# Patient Record
Sex: Female | Born: 1940 | Race: White | Hispanic: No | Smoking: Never smoker
Health system: Southern US, Community
[De-identification: ages and names within clinical notes are randomized; demographics above are authoritative.]

## PROBLEM LIST (undated history)

## (undated) DIAGNOSIS — I10 Essential (primary) hypertension: Secondary | ICD-10-CM

## (undated) DIAGNOSIS — E7800 Pure hypercholesterolemia, unspecified: Secondary | ICD-10-CM

## (undated) HISTORY — PX: APPENDECTOMY: SHX54

## (undated) HISTORY — DX: Pure hypercholesterolemia, unspecified: E78.00

## (undated) HISTORY — DX: Essential (primary) hypertension: I10

## (undated) HISTORY — PX: HIATAL HERNIA REPAIR: SHX195

---

## 2014-06-23 ENCOUNTER — Encounter (INDEPENDENT_AMBULATORY_CARE_PROVIDER_SITE_OTHER): Payer: Self-pay | Admitting: *Deleted

## 2014-07-19 ENCOUNTER — Ambulatory Visit (INDEPENDENT_AMBULATORY_CARE_PROVIDER_SITE_OTHER): Payer: Self-pay | Admitting: Internal Medicine

## 2014-09-01 ENCOUNTER — Encounter (INDEPENDENT_AMBULATORY_CARE_PROVIDER_SITE_OTHER): Payer: Self-pay | Admitting: *Deleted

## 2014-10-19 ENCOUNTER — Encounter (INDEPENDENT_AMBULATORY_CARE_PROVIDER_SITE_OTHER): Payer: Self-pay | Admitting: *Deleted

## 2014-11-17 ENCOUNTER — Ambulatory Visit (INDEPENDENT_AMBULATORY_CARE_PROVIDER_SITE_OTHER): Payer: Medicare Other | Admitting: Internal Medicine

## 2014-11-17 ENCOUNTER — Encounter (INDEPENDENT_AMBULATORY_CARE_PROVIDER_SITE_OTHER): Payer: Self-pay | Admitting: Internal Medicine

## 2014-11-17 DIAGNOSIS — I10 Essential (primary) hypertension: Secondary | ICD-10-CM | POA: Insufficient documentation

## 2014-11-17 DIAGNOSIS — K921 Melena: Secondary | ICD-10-CM | POA: Diagnosis not present

## 2014-11-17 DIAGNOSIS — E7800 Pure hypercholesterolemia, unspecified: Secondary | ICD-10-CM | POA: Insufficient documentation

## 2014-11-17 DIAGNOSIS — R413 Other amnesia: Secondary | ICD-10-CM | POA: Insufficient documentation

## 2014-11-17 NOTE — Patient Instructions (Signed)
OV in 6 months. CBC was normal. Suspect black stool from the Pepto Bismol.

## 2014-11-17 NOTE — Progress Notes (Addendum)
   Subjective:    Patient ID: Belinda English, female    DOB: 05/25/1940, 74 y.o.   MRN: [id number]  HPI Referred to our office by Dr. Mardene Celeste for dark stools, abdominal discomfort. Per notes on 09/27/2014 she had dark black stools after taking Pepto Bismol. She thinks it was the guacamole she ate.  She says she may have had 2-3 black stools. She has not had any further black stools.  Longstanding hx of abdominal discomfort and constipation . She usually has a BM daily with a Colace. She has not seen any black stools since June. Her stools are brown in color.  Her last colonoscopy was ? 2009 or 2010 by Dr. Aleene Davidson and was normal. She will be due for one in 2020.  Her appetite is good. No weight loss. She does have some acid reflux but does not want to take any    09/28/2014 H and H 13.0 and 39.2. Review of Systems Past Medical History  Diagnosis Date  . Hypertension   . High cholesterol     Past Surgical History  Procedure Laterality Date  . Hiatal hernia repair      2008 Dr. Tamala Bari  . Appendectomy      No Known Allergies  No current outpatient prescriptions on file prior to visit.   No current facility-administered medications on file prior to visit.        Objective:   Physical ExamBlood pressure 102/82, pulse 60, temperature 97.9 F (36.6 C), height  (1.651 m), weight 149 lb 9.6 oz (67.858 kg). Alert and oriented. Skin warm and dry. Oral mucosa is moist.   . Sclera anicteric, conjunctivae is pink. Thyroid not enlarged. No cervical lymphadenopathy. Lungs clear. Heart regular rate and rhythm.  Abdomen is soft. Bowel sounds are positive. No hepatomegaly. No abdominal masses felt. No tenderness.  No edema to lower extremities    Developer: [id number] ( 01/2018) Card: Lot [id number] (12/18)     Assessment & Plan:  Hx of black stools in June after taking Pepto Bismol for an upset stomach. No further problems. Stools was guaiac negative today. CBC was normal.  OV in 6  months. Any further black stools, please call our office. Stool cards x 3.

## 2014-11-22 ENCOUNTER — Encounter (INDEPENDENT_AMBULATORY_CARE_PROVIDER_SITE_OTHER): Payer: Self-pay

## 2014-11-25 ENCOUNTER — Encounter (INDEPENDENT_AMBULATORY_CARE_PROVIDER_SITE_OTHER): Payer: Self-pay | Admitting: *Deleted

## 2015-02-10 ENCOUNTER — Telehealth (INDEPENDENT_AMBULATORY_CARE_PROVIDER_SITE_OTHER): Payer: Self-pay | Admitting: *Deleted

## 2015-02-10 NOTE — Telephone Encounter (Signed)
   Diagnosis:    Result(s)   Card 1: Negative:     Card 2: Negative:   Card 3:Negative:    Completed by: Larsen Zettel LPN   HEMOCCULT SENSA DEVELOPER: LOT#:  [id number] EXPIRATION DATE: 9-17   HEMOCCULT SENSA CARD:  LOT#: [id number]  EXPIRATION DATE: 07/18   CARD CONTROL RESULTS:  POSITIVE: Positive NEGATIVE: Negative    ADDITIONAL COMMENTS: This was forwarded to Delrae Renderri Setzer,NP for review.

## 2015-02-11 NOTE — Telephone Encounter (Signed)
Message left at home 

## 2015-02-16 ENCOUNTER — Encounter (INDEPENDENT_AMBULATORY_CARE_PROVIDER_SITE_OTHER): Payer: Self-pay | Admitting: *Deleted

## 2015-02-21 ENCOUNTER — Encounter (INDEPENDENT_AMBULATORY_CARE_PROVIDER_SITE_OTHER): Payer: Self-pay

## 2015-02-21 ENCOUNTER — Encounter (INDEPENDENT_AMBULATORY_CARE_PROVIDER_SITE_OTHER): Payer: Self-pay | Admitting: Internal Medicine

## 2015-02-21 ENCOUNTER — Ambulatory Visit (INDEPENDENT_AMBULATORY_CARE_PROVIDER_SITE_OTHER): Payer: Medicare Other | Admitting: Internal Medicine

## 2015-02-21 VITALS — BP 112/82 | HR 64 | Temp 97.0°F | Ht 66.0 in | Wt 151.0 lb

## 2015-02-21 DIAGNOSIS — R110 Nausea: Secondary | ICD-10-CM

## 2015-02-21 MED ORDER — ONDANSETRON HCL 4 MG PO TABS: 4.0000 mg | ORAL_TABLET | Freq: Three times a day (TID) | ORAL | Status: DC | PRN

## 2015-02-21 NOTE — Progress Notes (Addendum)
Subjective:    Patient ID: Margareta Laureano, female    DOB: Dec 19, 1940, 74 y.o.   MRN: [id number]  HPI Here today for f/u. He c/o nausea. She tells me she has nausea when she is hungry. She had symptoms for 2-3 weeks. She had an US abdomen one month ago for the nausea and it was normal except for a polyp in her gallbladder. She tells me the nausea resolved after she started taking the  Lexpro. Her appetite has remained good. No weight loss. She has gained 2 pounds since her last visit. She says she had surgery in 2008 for a hiatal hernia at [facility] by Dr. Regino Schultze. Her last colonoscopy was 2009 by Dr. Aleene Davidson which reveal mild internal hemorrhoids, mild diverticulosis.Cecum was visualized. She will be due for one in 2020.  She was last seen in August with c/o black stools after taking Pepto Bismol. Her stool card in office was negative as well as the 3 cards I sent home with her. Her hemoglobin in June was normal.   01/17/2015 US abdomen: nausea:  Simple cysts involving the liver and the right kidney. Single tiny 4 mm gallbladder polyp. Otherwise normal [place]. CBD is 4mm.   09/28/2014 H and H 13.0 AND 39.2, mcv 90.3  Colonoscopy 07/29/2007 Dr. Aleene Davidson: Weight loss, abdominal pain, constipation:  Mild internal hemorrhoids, mild diverticulosis. Cecum was visualized.  09/17/2006 EGD: chest pain regurgitation: Dr. Aleene Davidson: Moderately large hiatus hernia. No erosions, stricture or Barrett's. Biopsies taken mid-esophagus to rule out eosinophilia as cause of chest pain.  Good gastric peristalsis. Biopsy: benign fragments of esophageal mucosa, mid esophagus, endoscopic biopsies.  12/11/2000: colonoscopy with polypectomy, Dr. Aleene Davidson;  Indications: Colon polyp: Mild diverticulosis of the sigmoid colon. Small polyp in the mid transverse colon removed by hot biopsy. Remaining colon to cecum normal. Cecum well visualized.  Biopsy: There is mild increase in the inflammatory component of the lamina  propria.   01/18/2015 Labs:   WBC 5.6, H and H 13.2 AND 38.6, mcv 89.8, pLATELET CT 257,TOTAL BILI 0.5, ALP 65, AST 15, ALT 10, Albumin 4.2, Amylase 61, Lipase 19.    Review of Systems Past Medical History  Diagnosis Date  . Hypertension   . High cholesterol     Past Surgical History  Procedure Laterality Date  . Hiatal hernia repair      2008 Dr. Tamala Bari  . Appendectomy      No Known Allergies  Current Outpatient Prescriptions on File Prior to Visit  Medication Sig Dispense Refill  . Ascorbic Acid (VITAMIN C) 1000 MG tablet Take 1,000 mg by mouth daily.    . cholecalciferol (VITAMIN D) 400 UNITS TABS tablet Take 1,000 Units by mouth.    . co-enzyme Q-10 30 MG capsule Take 200 mg by mouth 3 (three) times daily.    . cyanocobalamin 500 MCG tablet Take 500 mcg by mouth daily.    Marland Kitchen docusate sodium (COLACE) 100 MG capsule Take 100 mg by mouth daily.    . hydrochlorothiazide (HYDRODIURIL) 12.5 MG tablet Take 12.5 mg by mouth daily.    Marland Kitchen lovastatin (MEVACOR) 20 MG tablet Take 20 mg by mouth at bedtime.    . Omega-3 Fatty Acids (FISH OIL) 1000 MG CAPS Take by mouth.    . vitamin A 40981 UNIT capsule Take 8,000 Units by mouth daily.     No current facility-administered medications on file prior to visit.        Objective:   Physical Exam Blood pressure 112/82,  pulse 64, temperature 97 F (36.1 C), height 5\' 6"  (1.676 m), weight 151 lb (68.493 kg).  Alert and oriented. Skin warm and dry. Oral mucosa is moist.   . Sclera anicteric, conjunctivae is pink. Thyroid not enlarged. No cervical lymphadenopathy. Lungs clear. Heart regular rate and rhythm.  Abdomen is soft. Bowel sounds are positive. No hepatomegaly. No abdominal masses felt. No tenderness.  No edema to lower extremities.        Assessment & Plan:  Nausea which has now resolved. US she reports was normal.  Will get recent lab work, US, and last colonoscopy from Dr. Ruby ColaPomposina's office.

## 2015-02-21 NOTE — Patient Instructions (Signed)
Zofran if nausea returns. OV 1 year.

## 2015-04-13 ENCOUNTER — Ambulatory Visit (INDEPENDENT_AMBULATORY_CARE_PROVIDER_SITE_OTHER): Payer: Medicare Other | Admitting: Internal Medicine

## 2015-04-13 ENCOUNTER — Encounter (INDEPENDENT_AMBULATORY_CARE_PROVIDER_SITE_OTHER): Payer: Self-pay | Admitting: Internal Medicine

## 2015-04-13 VITALS — BP 104/66 | HR 72 | Temp 97.9°F | Ht 66.0 in | Wt 147.9 lb

## 2015-04-13 DIAGNOSIS — R110 Nausea: Secondary | ICD-10-CM

## 2015-04-13 LAB — CBC WITH DIFFERENTIAL/PLATELET
BASOS PCT: 1 % (ref 0–1)
Basophils Absolute: 0.1 10*3/uL (ref 0.0–0.1)
EOS ABS: 0.3 10*3/uL (ref 0.0–0.7)
Eosinophils Relative: 4 % (ref 0–5)
HCT: 41.9 % (ref 36.0–46.0)
HEMOGLOBIN: 14 g/dL (ref 12.0–15.0)
Lymphocytes Relative: 29 % (ref 12–46)
Lymphs Abs: 1.9 10*3/uL (ref 0.7–4.0)
MCH: 30.1 pg (ref 26.0–34.0)
MCHC: 33.4 g/dL (ref 30.0–36.0)
MCV: 90.1 fL (ref 78.0–100.0)
MPV: 10.4 fL (ref 8.6–12.4)
Monocytes Absolute: 0.6 10*3/uL (ref 0.1–1.0)
Monocytes Relative: 9 % (ref 3–12)
NEUTROS ABS: 3.7 10*3/uL (ref 1.7–7.7)
NEUTROS PCT: 57 % (ref 43–77)
PLATELETS: 277 10*3/uL (ref 150–400)
RBC: 4.65 MIL/uL (ref 3.87–5.11)
RDW: 13.4 % (ref 11.5–15.5)
WBC: 6.5 10*3/uL (ref 4.0–10.5)

## 2015-04-13 LAB — HEPATIC FUNCTION PANEL
ALBUMIN: 4 g/dL (ref 3.6–5.1)
ALK PHOS: 62 U/L (ref 33–130)
ALT: 10 U/L (ref 6–29)
AST: 16 U/L (ref 10–35)
BILIRUBIN DIRECT: 0.1 mg/dL (ref <=0.2)
BILIRUBIN TOTAL: 0.4 mg/dL (ref 0.2–1.2)
Indirect Bilirubin: 0.3 mg/dL (ref 0.2–1.2)
Total Protein: 6.6 g/dL (ref 6.1–8.1)

## 2015-04-13 NOTE — Patient Instructions (Signed)
CBC,  Hepatic function.  OV in 6 months 

## 2015-04-13 NOTE — Progress Notes (Signed)
Subjective:    Patient ID: Belinda English, female    DOB: 05/20/1940, 75 y.o.   MRN: [id number]  HPI Here today for f/u of her nausea. She tells me she is still having nausea. She has nausea every morning. She tells me the nausea resolves in the afternoon.  She has lost about 3 pounds since her last visit. She really has no further GI symptoms.  She has had headaches for months.  Her appetite is good for the most part. She usually has a BM daily. No melena or BRRB. She has seen Dr. Alita ChylePompisini for her headaches and she has undergone CT headache which was normal she reports.  She is not under a lot of stress.  Hx of inner ear. Her last colonoscopy was 2009 by Dr. Aleene DavidsonSpainhour which reveal mild internal hemorrhoids, mild diverticulosis.Cecum was visualized. She will be due for one in 2020.  She was last seen in August with c/o black stools after taking Pepto Bismol. Her stool card in office was negative as well as the 3 cards I sent home with her. Her hemoglobin in June was normal.   01/17/2015 US abdomen: nausea:   Simple cysts involving the liver and the right kidney. Single tiny 4 mm gallbladder polyp. Otherwise normal US. CBD is 4mm.   09/28/2014 H and H 13.0 AND 39.2, mcv 90.3  Colonoscopy 07/29/2007 Dr. Aleene Davidson[place]hour: Weight loss, abdominal pain, constipation:  Mild internal hemorrhoids, mild diverticulosis. Cecum was visualized.  09/17/2006 EGD: chest pain regurgitation: Dr. Aleene DavidsonSpainhour: Moderately large hiatus hernia. No erosions, stricture or Barrett's. Biopsies taken mid-esophagus to rule out eosinophilia as cause of chest pain. Good gastric peristalsis. Biopsy: benign fragments of esophageal mucosa, mid esophagus, endoscopic biopsies.  12/11/2000: colonoscopy with polypectomy, Dr. Aleene DavidsonSpainhour; Indications: Colon polyp: Mild diverticulosis of the sigmoid colon. Small polyp in the mid transverse colon removed by hot biopsy. Remaining colon to cecum normal. Cecum well visualized.  Biopsy:  There is mild increase in the inflammatory component of the lamina propria.   01/18/2015 Labs:   WBC 5.6, H and H 13.2 AND 38.6, mcv 89.8, pLATELET CT 257,TOTAL BILI 0.5, ALP 65, AST 15, ALT 10, Albumin 4.2, Amylase 61, Lipase 19.    Review of Systems Past Medical History  Diagnosis Date  . Hypertension   . High cholesterol     Past Surgical History  Procedure Laterality Date  . Hiatal hernia repair      2008 Dr. Tamala BariLague  . Appendectomy      No Known Allergies  Current Outpatient Prescriptions on File Prior to Visit  Medication Sig Dispense Refill  . Ascorbic Acid (VITAMIN C) 1000 MG tablet Take 1,000 mg by mouth daily.    . cholecalciferol (VITAMIN D) 400 UNITS TABS tablet Take 1,000 Units by mouth.    . co-enzyme Q-10 30 MG capsule Take 200 mg by mouth 3 (three) times daily.    . cyanocobalamin 500 MCG tablet Take 500 mcg by mouth daily.    Marland Kitchen. docusate sodium (COLACE) 100 MG capsule Take 100 mg by mouth daily.    Marland Kitchen. escitalopram (LEXAPRO) 10 MG tablet Take 10 mg by mouth daily.    . hydrochlorothiazide (HYDRODIURIL) 12.5 MG tablet Take 12.5 mg by mouth daily.    Marland Kitchen. lovastatin (MEVACOR) 20 MG tablet Take 20 mg by mouth at bedtime.    . Omega-3 Fatty Acids (FISH OIL) 1000 MG CAPS Take by mouth.    . ondansetron (ZOFRAN) 4 MG tablet Take 1 tablet (4 mg  total) by mouth every 8 (eight) hours as needed for nausea or vomiting. 30 tablet 1  . vitamin A 91478 UNIT capsule Take 8,000 Units by mouth daily.     No current facility-administered medications on file prior to visit.        Objective:   Physical Exam Blood pressure 104/66, pulse 72, temperature 97.9 F (36.6 C), height 5\' 6"  (1.676 m), weight 147 lb 14.4 oz (67.087 kg). Alert and oriented. Skin warm and dry. Oral mucosa is moist.   . Sclera anicteric, conjunctivae is pink. Thyroid not enlarged. No cervical lymphadenopathy. Lungs clear. Heart regular rate and rhythm.  Abdomen is soft. Bowel sounds are positive. No  hepatomegaly. No abdominal masses felt. No tenderness.  No edema to lower extremities.          Assessment & Plan:  Nausea associated with her Headaches. She may need referral to a neurologist for her headaches. Will get a CBC and Hepatic function today.  Patient advised not to drive and take the Phenergan. She will have OV in 6 months.

## 2015-06-08 ENCOUNTER — Ambulatory Visit (INDEPENDENT_AMBULATORY_CARE_PROVIDER_SITE_OTHER): Payer: Medicare Other | Admitting: Internal Medicine

## 2015-10-12 ENCOUNTER — Ambulatory Visit (INDEPENDENT_AMBULATORY_CARE_PROVIDER_SITE_OTHER): Payer: Medicare Other | Admitting: Internal Medicine

## 2015-12-27 ENCOUNTER — Ambulatory Visit (INDEPENDENT_AMBULATORY_CARE_PROVIDER_SITE_OTHER): Payer: Medicare Other | Admitting: Internal Medicine

## 2016-12-06 ENCOUNTER — Emergency Department (HOSPITAL_COMMUNITY): Payer: Medicare Other

## 2016-12-06 ENCOUNTER — Emergency Department (HOSPITAL_COMMUNITY)
Admission: EM | Admit: 2016-12-06 | Discharge: 2016-12-06 | Disposition: A | Discharge status: Home / Self Care | Payer: Medicare Other | Attending: Emergency Medicine | Admitting: Emergency Medicine

## 2016-12-06 ENCOUNTER — Encounter (HOSPITAL_COMMUNITY): Payer: Self-pay

## 2016-12-06 DIAGNOSIS — Z79899 Other long term (current) drug therapy: Secondary | ICD-10-CM | POA: Insufficient documentation

## 2016-12-06 DIAGNOSIS — M47812 Spondylosis without myelopathy or radiculopathy, cervical region: Secondary | ICD-10-CM

## 2016-12-06 DIAGNOSIS — R51 Headache: Secondary | ICD-10-CM | POA: Insufficient documentation

## 2016-12-06 DIAGNOSIS — R519 Headache, unspecified: Secondary | ICD-10-CM

## 2016-12-06 DIAGNOSIS — K449 Diaphragmatic hernia without obstruction or gangrene: Secondary | ICD-10-CM | POA: Insufficient documentation

## 2016-12-06 DIAGNOSIS — M1990 Unspecified osteoarthritis, unspecified site: Secondary | ICD-10-CM | POA: Diagnosis not present

## 2016-12-06 DIAGNOSIS — M542 Cervicalgia: Secondary | ICD-10-CM

## 2016-12-06 DIAGNOSIS — I10 Essential (primary) hypertension: Secondary | ICD-10-CM | POA: Diagnosis not present

## 2016-12-06 LAB — URINALYSIS, ROUTINE W REFLEX MICROSCOPIC
Bilirubin Urine: NEGATIVE
Glucose, UA: NEGATIVE mg/dL
HGB URINE DIPSTICK: NEGATIVE
Ketones, ur: 5 mg/dL — AB
Leukocytes, UA: NEGATIVE
Nitrite: NEGATIVE
Protein, ur: NEGATIVE mg/dL
SPECIFIC GRAVITY, URINE: 1.016 (ref 1.005–1.030)
pH: 6 (ref 5.0–8.0)

## 2016-12-06 LAB — COMPREHENSIVE METABOLIC PANEL
ALBUMIN: 4.2 g/dL (ref 3.5–5.0)
ALK PHOS: 64 U/L (ref 38–126)
ALT: 14 U/L (ref 14–54)
ANION GAP: 8 (ref 5–15)
AST: 21 U/L (ref 15–41)
BUN: 13 mg/dL (ref 6–20)
CALCIUM: 9.7 mg/dL (ref 8.9–10.3)
CO2: 27 mmol/L (ref 22–32)
Chloride: 100 mmol/L — ABNORMAL LOW (ref 101–111)
Creatinine, Ser: 0.66 mg/dL (ref 0.44–1.00)
GFR calc Af Amer: >60 mL/min (ref >60)
GLUCOSE: 127 mg/dL — AB (ref 65–99)
Potassium: 3.9 mmol/L (ref 3.5–5.1)
Sodium: 135 mmol/L (ref 135–145)
TOTAL PROTEIN: 7.4 g/dL (ref 6.5–8.1)
Total Bilirubin: 0.7 mg/dL (ref 0.3–1.2)

## 2016-12-06 LAB — LIPASE, BLOOD: Lipase: 25 U/L (ref 11–51)

## 2016-12-06 LAB — CBC
HCT: 41.8 % (ref 36.0–46.0)
HEMOGLOBIN: 14.1 g/dL (ref 12.0–15.0)
MCH: 30.7 pg (ref 26.0–34.0)
MCHC: 33.7 g/dL (ref 30.0–36.0)
MCV: 90.9 fL (ref 78.0–100.0)
Platelets: 264 10*3/uL (ref 150–400)
RBC: 4.6 MIL/uL (ref 3.87–5.11)
RDW: 12.8 % (ref 11.5–15.5)
WBC: 5.9 10*3/uL (ref 4.0–10.5)

## 2016-12-06 MED ORDER — ONDANSETRON HCL 8 MG PO TABS: 8.0000 mg | ORAL_TABLET | Freq: Three times a day (TID) | ORAL | 0 refills | Status: AC | PRN

## 2016-12-06 MED ORDER — TRAMADOL HCL 50 MG PO TABS: 50.0000 mg | ORAL_TABLET | Freq: Four times a day (QID) | ORAL | 0 refills | Status: AC | PRN

## 2016-12-06 MED ORDER — IOPAMIDOL (ISOVUE-300) INJECTION 61%
100.0000 mL | Freq: Once | INTRAVENOUS | Status: AC | PRN
  Administered 2016-12-06: 100 mL via INTRAVENOUS

## 2016-12-06 MED ORDER — ONDANSETRON HCL 4 MG/2ML IJ SOLN: 4.0000 mg | Freq: Once | INTRAMUSCULAR | Status: DC

## 2016-12-06 MED ORDER — PREDNISONE 10 MG PO TABS: 20.0000 mg | ORAL_TABLET | Freq: Two times a day (BID) | ORAL | 0 refills | Status: AC

## 2016-12-06 MED ORDER — SODIUM CHLORIDE 0.9 % IV BOLUS (SEPSIS)
500.0000 mL | Freq: Once | INTRAVENOUS | Status: AC
  Administered 2016-12-06: 500 mL via INTRAVENOUS

## 2016-12-06 MED ORDER — ONDANSETRON HCL 4 MG/2ML IJ SOLN
4.0000 mg | Freq: Once | INTRAMUSCULAR | Status: AC
  Administered 2016-12-06: 4 mg via INTRAVENOUS
  Filled 2016-12-06: qty 2

## 2016-12-06 MED ORDER — METHYLPREDNISOLONE SODIUM SUCC 125 MG IJ SOLR
125.0000 mg | Freq: Once | INTRAMUSCULAR | Status: AC
  Administered 2016-12-06: 125 mg via INTRAVENOUS
  Filled 2016-12-06: qty 2

## 2016-12-06 NOTE — Discharge Instructions (Signed)
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  The nausea is nonspecific and may be associated with pain, or your hiatal hernia.  Try using heat on the sore area of your neck 3 or 4 times a day.  See your doctor next week to discuss the results, and see how you are doing.

## 2016-12-06 NOTE — ED Provider Notes (Signed)
[facility]-EMERGENCY DEPT Provider Note   CSN: [id number] Arrival date & time: 12/06/16  1023     History   Chief Complaint Chief Complaint  Patient presents with  . Nausea  . Headache    HPI Belinda English is a 76 y.o. female.  She presents for evaluation of headache neck pain and nausea.  Symptoms present for 1 week.  Tends wax and wane.  They are associated with anorexia.  The headache is moderate, and this morning she felt so bad that she told her friend she thought she was "going to die."  Headache is improved spontaneously.  She denies fever, chills, vomiting, diarrhea, dysuria, urinary frequency, paresthesias, focal weakness.  No prior similar problems.  No known trauma.  She is taking her usual medications.  There are no other known modifying factors.  HPI  Past Medical History:  Diagnosis Date  . High cholesterol   . Hypertension     Patient Active Problem List   Diagnosis Date Noted  . High blood pressure 11/17/2014  . High cholesterol 11/17/2014  . Memory problem 11/17/2014    Past Surgical History:  Procedure Laterality Date  . APPENDECTOMY    . HIATAL HERNIA REPAIR     2008 Dr. Tamala BariLague    OB History    No data available       Home Medications    Prior to Admission medications   Medication Sig Start Date End Date Taking? Authorizing Provider  betamethasone dipropionate (DIPROLENE) 0.05 % ointment Apply 1 application topically daily as needed.   Yes [provider]  BISACODYL EC PO Take 1 tablet by mouth daily as needed.   Yes [provider]  co-enzyme Q-10 30 MG capsule Take 200 mg by mouth daily.    Yes [provider]  cyanocobalamin 500 MCG tablet Take 500 mcg by mouth daily.   Yes [provider]  docusate sodium (COLACE) 100 MG capsule Take 100 mg by mouth daily.   Yes [provider]  escitalopram (LEXAPRO) 10 MG tablet Take 10 mg by mouth daily.   Yes [provider]  hydrochlorothiazide  (HYDRODIURIL) 12.5 MG tablet Take 12.5 mg by mouth daily.   Yes [provider]  ipratropium (ATROVENT) 0.03 % nasal spray Place 1-2 sprays into both nostrils daily as needed. 11/20/16  Yes [provider]  lovastatin (MEVACOR) 20 MG tablet Take 20 mg by mouth at bedtime.   Yes [provider]  Omega-3 Fatty Acids (FISH OIL) 1000 MG CAPS Take by mouth.   Yes [provider]  ondansetron (ZOFRAN) 8 MG tablet Take 1 tablet (8 mg total) by mouth every 8 (eight) hours as needed for nausea or vomiting. 12/06/16   Mancel BaleWentz, Maycol Hoying, MD  predniSONE (DELTASONE) 10 MG tablet Take 2 tablets (20 mg total) by mouth 2 (two) times daily. 12/06/16   Mancel BaleWentz, Louden Houseworth, MD  traMADol (ULTRAM) 50 MG tablet Take 1 tablet (50 mg total) by mouth every 6 (six) hours as needed. 12/06/16   Mancel BaleWentz, Murel Shenberger, MD    Family History Family History  Problem Relation Age of Onset  . Uterine cancer Brother   . Stomach cancer Sister     Social History Social History  Substance Use Topics  . Smoking status: Never Smoker  . Smokeless tobacco: Never Used  . Alcohol use No     Allergies   Patient has no known allergies.   Review of Systems Review of Systems  All other systems reviewed and are  negative.    Physical Exam Updated Vital Signs BP (!) 144/78   Pulse 70   Temp 98.2 F (36.8 C) (Oral)   Resp 16   Ht  (1.676 m)   Wt 68 kg (150 lb)   SpO2 97%   BMI 24.21 kg/m   Physical Exam  Constitutional: She is oriented to person, place, and time. She appears well-developed and well-nourished. No distress.  HENT:  Head: Normocephalic and atraumatic.  Eyes: Pupils are equal, round, and reactive to light. Conjunctivae and EOM are normal.  Neck: Normal range of motion and phonation normal. Neck supple.  Cardiovascular: Normal rate and regular rhythm.   Pulmonary/Chest: Effort normal and breath sounds normal. She exhibits no tenderness.  Abdominal: Soft. She exhibits no distension.  There is no tenderness. There is no guarding.  Musculoskeletal: Normal range of motion.  Neurological: She is alert and oriented to person, place, and time. She exhibits normal muscle tone.  Skin: Skin is warm and dry.  Psychiatric: She has a normal mood and affect. Her behavior is normal. Judgment and thought content normal.  Nursing note and vitals reviewed.    ED Treatments / Results  Labs (all labs ordered are listed, but only abnormal results are displayed) Labs Reviewed  COMPREHENSIVE METABOLIC PANEL - Abnormal; Notable for the following:       Result Value   Chloride 100 (*)    Glucose, Bld 127 (*)    All other components within normal limits  URINALYSIS, ROUTINE W REFLEX MICROSCOPIC - Abnormal; Notable for the following:    Ketones, ur 5 (*)    All other components within normal limits  LIPASE, BLOOD  CBC    EKG  EKG Interpretation None       Radiology Ct Head Wo Contrast  Result Date: 12/06/2016 CLINICAL DATA:  Headache and dizziness with neck pain EXAM: CT HEAD WITHOUT CONTRAST CT CERVICAL SPINE WITHOUT CONTRAST TECHNIQUE: Multidetector CT imaging of the head and cervical spine was performed following the standard protocol without intravenous contrast. Multiplanar CT image reconstructions of the cervical spine were also generated. COMPARISON:  None. FINDINGS: CT HEAD FINDINGS Brain: No evidence of acute infarction, hemorrhage, hydrocephalus, extra-axial collection or mass lesion/mass effect. There is chronic diffuse atrophy. Vascular: No hyperdense vessel or unexpected calcification. Skull: Normal. Negative for fracture or focal lesion. Sinuses/Orbits: No acute finding. Other: None. CT CERVICAL SPINE FINDINGS Alignment: Normal. Skull base and vertebrae: No acute fracture. No primary bone lesion or focal pathologic process. Soft tissues and spinal canal: No prevertebral fluid or swelling. No visible canal hematoma. Disc levels: The intervertebral disc spaces are normal.  There are facet joint sclerosis throughout cervical spine. Upper chest: Biapical scarring are noted. Other: Right parotid gland calcifications are noted. IMPRESSION: No focal acute intracranial abnormality identified. No acute fracture or dislocation of cervical spine. Degenerative joint changes of cervical spine. Electronically Signed   By: Sherian Rein M.D.   On: 12/06/2016 17:20   Ct Cervical Spine Wo Contrast  Result Date: 12/06/2016 CLINICAL DATA:  Headache and dizziness with neck pain EXAM: CT HEAD WITHOUT CONTRAST CT CERVICAL SPINE WITHOUT CONTRAST TECHNIQUE: Multidetector CT imaging of the head and cervical spine was performed following the standard protocol without intravenous contrast. Multiplanar CT image reconstructions of the cervical spine were also generated. COMPARISON:  None. FINDINGS: CT HEAD FINDINGS Brain: No evidence of acute infarction, hemorrhage, hydrocephalus, extra-axial collection or mass lesion/mass effect. There is chronic diffuse atrophy. Vascular: No hyperdense vessel or unexpected  calcification. Skull: Normal. Negative for fracture or focal lesion. Sinuses/Orbits: No acute finding. Other: None. CT CERVICAL SPINE FINDINGS Alignment: Normal. Skull base and vertebrae: No acute fracture. No primary bone lesion or focal pathologic process. Soft tissues and spinal canal: No prevertebral fluid or swelling. No visible canal hematoma. Disc levels: The intervertebral disc spaces are normal. There are facet joint sclerosis throughout cervical spine. Upper chest: Biapical scarring are noted. Other: Right parotid gland calcifications are noted. IMPRESSION: No focal acute intracranial abnormality identified. No acute fracture or dislocation of cervical spine. Degenerative joint changes of cervical spine. Electronically Signed   By: Sherian Rein M.D.   On: 12/06/2016 17:20   Ct Abdomen Pelvis W Contrast  Result Date: 12/06/2016 CLINICAL DATA:  Nausea vomiting and pancreatitis EXAM: CT  ABDOMEN AND PELVIS WITH CONTRAST TECHNIQUE: Multidetector CT imaging of the abdomen and pelvis was performed using the standard protocol following bolus administration of intravenous contrast. CONTRAST:  ISOVUE-300 IOPAMIDOL (ISOVUE-300) INJECTION 61% COMPARISON:  None. FINDINGS: Lower chest: Lung bases demonstrate no consolidation or pleural effusion. Moderate hiatal hernia with mild mucosal enhancement of the herniated stomach. Normal heart size. Hepatobiliary: 2.2 cm cyst in the medial right hepatic lobe. No calcified gallstones or biliary dilatation Pancreas: Unremarkable. No pancreatic ductal dilatation or surrounding inflammatory changes. Spleen: Normal in size without focal abnormality. Adrenals/Urinary Tract: Adrenal glands are within normal limits. No hydronephrosis. 3.1 cm cyst upper pole right kidney. Bladder within normal limits. Stomach/Bowel: Stomach nonenlarged. No dilated small bowel. No colon wall thickening. Appendix not clearly identified, surgical changes in the right lower quadrant suggesting prior appendectomy. Vascular/Lymphatic: Aortic atherosclerosis. No enlarged abdominal or pelvic lymph nodes. Reproductive: Uterus and bilateral adnexa are unremarkable. Other: Fat in the left inguinal canal. Fat and bowel containing right inguinal hernia. Musculoskeletal: Prominent infolding of the inner abdominal wall musculature causing appearance of bandlike densities along the lateral aspects of the abdomen. This is of uncertain significance and etiology. No acute or suspicious bone lesion. IMPRESSION: 1. No CT evidence for acute intra-abdominal or pelvic pathology. 2. Moderate hiatal hernia. Prominent mucosal enhancement of the proximal herniated stomach, query a gastritis. 3. Negative for bowel obstruction. 4. Hepatic and renal cysts 5. Fat and bowel containing hernia in the right groin. No evidence for an obstruction Electronically Signed   By: Jasmine Pang M.D.   On: 12/06/2016 20:07     Procedures Procedures (including critical care time)  Medications Ordered in ED Medications  sodium chloride 0.9 % bolus 500 mL (0 mLs Intravenous Stopped 12/06/16 1841)  ondansetron (ZOFRAN) injection 4 mg (4 mg Intravenous Given 12/06/16 1745)  methylPREDNISolone sodium succinate (SOLU-MEDROL) 125 mg/2 mL injection 125 mg (125 mg Intravenous Given 12/06/16 1822)  iopamidol (ISOVUE-300) 61 % injection 100 mL (100 mLs Intravenous Contrast Given 12/06/16 1929)     Initial Impression / Assessment and Plan / ED Course  I have reviewed the triage vital signs and the nursing notes.  Pertinent labs & imaging results that were available during my care of the patient were reviewed by me and considered in my medical decision making (see chart for details).      Patient Vitals for the past 24 hrs:  BP Temp Temp src Pulse Resp SpO2 Height Weight  12/06/16 2111 - 98.2 F (36.8 C) Oral - - - - -  12/06/16 2100 (!) 144/78 - - 70 - 97 % - -  12/06/16 2030 (!) 149/74 - - 68 - 98 % - -  12/06/16 2012 Marland Kitchen)  149/88 - - 66 16 99 % - -  12/06/16 1900 (!) 141/74 - - 63 - 100 % - -  12/06/16 1830 (!) 146/75 - - 63 - 98 % - -  12/06/16 1745 - - - 67 - 98 % - -  12/06/16 1730 (!) 145/84 - - 64 - 97 % - -  12/06/16 1715 - - - 60 - 99 % - -  12/06/16 1645 - - - 64 - 100 % - -  12/06/16 1630 (!) 158/86 - - - - - - -  12/06/16 1610 (!) 153/87 - - 65 18 100 % - -  12/06/16 1352 (!) 155/85 98.3 F (36.8 C) Oral 64 15 99 % - -  12/06/16 1122 (!) 147/98 (!) 97.3 F (36.3 C) Temporal 76 18 98 % - -  12/06/16 1121 - - - - - -  (1.676 m) 68 kg (150 lb)    At discharge- reevaluation with update and discussion. After initial assessment and treatment, an updated evaluation reveals the patient is comfortable and has no further complaints.  Findings discussed with the patient and her daughter, all questions were answered. Helios Kohlmann L      Final Clinical Impressions(s) / ED Diagnoses   Final diagnoses:   Nonintractable headache, unspecified chronicity pattern, unspecified headache type  Neck pain  DJD (degenerative joint disease), cervical  Hiatal hernia    Nonspecific symptoms headache, with nausea, and ongoing anorexia.  Evaluation indicates cervical spondylosis likely contributing to headache and neck pain.  Anorexia and nausea could possibly be related to persistent hiatal hernia, with nonspecific mucosal changes in the gastric antrum, portion which is herniated into the chest.  It is unclear about the duration of the hernia, since the patient has previously had hiatal hernia repair, remotely.  No evidence for other intra-abdominal pathology.  Plan is to treat cervical spondylosis with a very short course of prednisone, to limit the gastric irritation.  For symptom control she will be given narcotic pain medication and and antiemetic medication, for symptom relief.  Nursing Notes Reviewed/ Care Coordinated Applicable Imaging Reviewed Interpretation of Laboratory Data incorporated into ED treatment  The patient appears reasonably screened and/or stabilized for discharge and I doubt any other medical condition or other [facility] requiring further screening, evaluation, or treatment in the ED at this time prior to discharge.  Plan: Home Medications-continue usual medications; Home Treatments-rest, gradually advance diet, heat to affected area on neck.; return here if the recommended treatment, does not improve the symptoms; Recommended follow up-PCP checkup 1 week.  Discuss with them, further possible evaluation and treatment for recurrent hiatal hernia.     New Prescriptions Discharge Medication List as of 12/06/2016  8:43 PM    START taking these medications   Details  ondansetron (ZOFRAN) 8 MG tablet Take 1 tablet (8 mg total) by mouth every 8 (eight) hours as needed for nausea or vomiting., Starting Thu 12/06/2016, Print    predniSONE (DELTASONE) 10 MG tablet Take 2 tablets (20 mg total) by  mouth 2 (two) times daily., Starting Thu 12/06/2016, Print    traMADol (ULTRAM) 50 MG tablet Take 1 tablet (50 mg total) by mouth every 6 (six) hours as needed., Starting Thu 12/06/2016, Print         Mancel Bale, MD 12/07/16 1110

## 2016-12-06 NOTE — ED Triage Notes (Signed)
Reports of mid abdominal pain, nausea and headache x2 days. Patient reports of every time she drinks the drink comes back up. Patient denies any food stuck in throat.  Denies any confusion, vision issues, numbness of weakness. States she has hital hernia surergy and often has issues with abdominal pain.

## 2016-12-06 NOTE — ED Notes (Signed)
ED Provider at bedside. 

## 2016-12-06 NOTE — ED Notes (Signed)
Patient transported to CT 

## 2017-06-13 ENCOUNTER — Encounter (INDEPENDENT_AMBULATORY_CARE_PROVIDER_SITE_OTHER): Payer: Self-pay | Admitting: *Deleted

## 2017-08-19 ENCOUNTER — Emergency Department (HOSPITAL_COMMUNITY): Payer: Medicare Other

## 2017-08-19 ENCOUNTER — Other Ambulatory Visit: Payer: Self-pay

## 2017-08-19 ENCOUNTER — Encounter (HOSPITAL_COMMUNITY): Payer: Self-pay

## 2017-08-19 ENCOUNTER — Emergency Department (HOSPITAL_COMMUNITY)
Admission: EM | Admit: 2017-08-19 | Discharge: 2017-08-19 | Disposition: A | Discharge status: Home / Self Care | Payer: Medicare Other | Attending: Emergency Medicine | Admitting: Emergency Medicine

## 2017-08-19 DIAGNOSIS — Z79899 Other long term (current) drug therapy: Secondary | ICD-10-CM | POA: Insufficient documentation

## 2017-08-19 DIAGNOSIS — I10 Essential (primary) hypertension: Secondary | ICD-10-CM | POA: Diagnosis not present

## 2017-08-19 DIAGNOSIS — R1110 Vomiting, unspecified: Secondary | ICD-10-CM | POA: Insufficient documentation

## 2017-08-19 DIAGNOSIS — R42 Dizziness and giddiness: Secondary | ICD-10-CM | POA: Insufficient documentation

## 2017-08-19 LAB — COMPREHENSIVE METABOLIC PANEL
ALT: 12 U/L — ABNORMAL LOW (ref 14–54)
AST: 19 U/L (ref 15–41)
Albumin: 4 g/dL (ref 3.5–5.0)
Alkaline Phosphatase: 57 U/L (ref 38–126)
Anion gap: 7 (ref 5–15)
BILIRUBIN TOTAL: 0.7 mg/dL (ref 0.3–1.2)
BUN: 9 mg/dL (ref 6–20)
CHLORIDE: 99 mmol/L — AB (ref 101–111)
CO2: 29 mmol/L (ref 22–32)
Calcium: 10 mg/dL (ref 8.9–10.3)
Creatinine, Ser: 0.57 mg/dL (ref 0.44–1.00)
GFR calc Af Amer: >60 mL/min (ref >60)
GFR calc non Af Amer: >60 mL/min (ref >60)
GLUCOSE: 105 mg/dL — AB (ref 65–99)
POTASSIUM: 3.2 mmol/L — AB (ref 3.5–5.1)
Sodium: 135 mmol/L (ref 135–145)
Total Protein: 6.9 g/dL (ref 6.5–8.1)

## 2017-08-19 LAB — CBC
HEMATOCRIT: 39.4 % (ref 36.0–46.0)
Hemoglobin: 12.9 g/dL (ref 12.0–15.0)
MCH: 30.1 pg (ref 26.0–34.0)
MCHC: 32.7 g/dL (ref 30.0–36.0)
MCV: 91.8 fL (ref 78.0–100.0)
Platelets: 218 10*3/uL (ref 150–400)
RBC: 4.29 MIL/uL (ref 3.87–5.11)
RDW: 12.9 % (ref 11.5–15.5)
WBC: 6.5 10*3/uL (ref 4.0–10.5)

## 2017-08-19 LAB — LIPASE, BLOOD: LIPASE: 30 U/L (ref 11–51)

## 2017-08-19 MED ORDER — ONDANSETRON HCL 4 MG/2ML IJ SOLN
4.0000 mg | Freq: Once | INTRAMUSCULAR | Status: AC | PRN
  Administered 2017-08-19: 4 mg via INTRAVENOUS

## 2017-08-19 MED ORDER — ONDANSETRON HCL 4 MG/2ML IJ SOLN
INTRAMUSCULAR | Status: AC
  Filled 2017-08-19: qty 2

## 2017-08-19 MED ORDER — PROMETHAZINE HCL 25 MG/ML IJ SOLN
12.5000 mg | Freq: Once | INTRAMUSCULAR | Status: AC
  Administered 2017-08-19: 12.5 mg via INTRAVENOUS
  Filled 2017-08-19: qty 1

## 2017-08-19 MED ORDER — SODIUM CHLORIDE 0.9 % IV BOLUS
1000.0000 mL | Freq: Once | INTRAVENOUS | Status: AC
  Administered 2017-08-19: 1000 mL via INTRAVENOUS

## 2017-08-19 MED ORDER — MECLIZINE HCL 25 MG PO TABS: 25.0000 mg | ORAL_TABLET | Freq: Three times a day (TID) | ORAL | 0 refills | Status: AC | PRN

## 2017-08-19 MED ORDER — PROMETHAZINE HCL 25 MG PO TABS: 25.0000 mg | ORAL_TABLET | Freq: Four times a day (QID) | ORAL | 0 refills | Status: AC | PRN

## 2017-08-19 MED ORDER — ONDANSETRON HCL 4 MG/2ML IJ SOLN
4.0000 mg | Freq: Once | INTRAMUSCULAR | Status: AC
  Administered 2017-08-19: 4 mg via INTRAVENOUS
  Filled 2017-08-19: qty 2

## 2017-08-19 MED ORDER — SODIUM CHLORIDE 0.9 % IV BOLUS
500.0000 mL | Freq: Once | INTRAVENOUS | Status: AC
  Administered 2017-08-19: 500 mL via INTRAVENOUS

## 2017-08-19 MED ORDER — METOCLOPRAMIDE HCL 5 MG/ML IJ SOLN
10.0000 mg | Freq: Once | INTRAMUSCULAR | Status: AC
  Administered 2017-08-19: 10 mg via INTRAVENOUS
  Filled 2017-08-19: qty 2

## 2017-08-19 NOTE — ED Provider Notes (Signed)
[facility] EMERGENCY DEPARTMENT Provider Note   CSN: [id number] Arrival date & time: 08/19/17  1236     History   Chief Complaint Chief Complaint  Patient presents with  . Emesis  . Dizziness    HPI Belinda English is a 77 y.o. female.  Patient complains of some dizziness and vomiting.  The history is provided by the patient. No language interpreter was used.  Emesis   This is a new problem. The current episode started 12 to 24 hours ago. The problem occurs 5 to 10 times per day. The problem has not changed since onset.The emesis has an appearance of stomach contents. There has been no fever. Pertinent negatives include no abdominal pain, no chills, no cough, no diarrhea and no headaches.    Past Medical History:  Diagnosis Date  . High cholesterol   . Hypertension     Patient Active Problem List   Diagnosis Date Noted  . High blood pressure 11/17/2014  . High cholesterol 11/17/2014  . Memory problem 11/17/2014    Past Surgical History:  Procedure Laterality Date  . APPENDECTOMY    . HIATAL HERNIA REPAIR     2008 Dr. Tamala Bari     OB History   None      Home Medications    Prior to Admission medications   Medication Sig Start Date End Date Taking? Authorizing Provider  betamethasone dipropionate (DIPROLENE) 0.05 % ointment Apply 1 application topically daily as needed.    [provider]  BISACODYL EC PO Take 1 tablet by mouth daily as needed.    [provider]  co-enzyme Q-10 30 MG capsule Take 200 mg by mouth daily.     [provider]  cyanocobalamin 500 MCG tablet Take 500 mcg by mouth daily.    [provider]  docusate sodium (COLACE) 100 MG capsule Take 100 mg by mouth daily.    [provider]  escitalopram (LEXAPRO) 10 MG tablet Take 10 mg by mouth daily.    [provider]  hydrochlorothiazide (HYDRODIURIL) 12.5 MG tablet Take 12.5 mg by mouth daily.    [provider]  ipratropium  (ATROVENT) 0.03 % nasal spray Place 1-2 sprays into both nostrils daily as needed. 11/20/16   [provider]  lovastatin (MEVACOR) 20 MG tablet Take 20 mg by mouth at bedtime.    [provider]  meclizine (ANTIVERT) 25 MG tablet Take 1 tablet (25 mg total) by mouth 3 (three) times daily as needed for dizziness. 08/19/17   Bethann Berkshire, MD  Omega-3 Fatty Acids (FISH OIL) 1000 MG CAPS Take by mouth.    [provider]  ondansetron (ZOFRAN) 8 MG tablet Take 1 tablet (8 mg total) by mouth every 8 (eight) hours as needed for nausea or vomiting. 12/06/16   Mancel Bale, MD  predniSONE (DELTASONE) 10 MG tablet Take 2 tablets (20 mg total) by mouth 2 (two) times daily. 12/06/16   Mancel Bale, MD  promethazine (PHENERGAN) 25 MG tablet Take 1 tablet (25 mg total) by mouth every 6 (six) hours as needed for nausea or vomiting. 08/19/17   Bethann Berkshire, MD  traMADol (ULTRAM) 50 MG tablet Take 1 tablet (50 mg total) by mouth every 6 (six) hours as needed. 12/06/16   Mancel Bale, MD    Family History Family History  Problem Relation Age of Onset  . Uterine cancer Brother   . Stomach cancer Sister     Social History Social History   Tobacco  Use  . Smoking status: Never Smoker  . Smokeless tobacco: Never Used  Substance Use Topics  . Alcohol use: No    Alcohol/week: 0.0 oz  . Drug use: No     Allergies   Patient has no known allergies.   Review of Systems Review of Systems  Constitutional: Negative for appetite change, chills and fatigue.  HENT: Negative for congestion, ear discharge and sinus pressure.   Eyes: Negative for discharge.  Respiratory: Negative for cough.   Cardiovascular: Negative for chest pain.  Gastrointestinal: Positive for vomiting. Negative for abdominal pain and diarrhea.  Genitourinary: Negative for frequency and hematuria.  Musculoskeletal: Negative for back pain.  Skin: Negative for rash.  Neurological: Positive for dizziness.  Negative for seizures and headaches.  Psychiatric/Behavioral: Negative for hallucinations.     Physical Exam Updated Vital Signs BP 108/69   Pulse 64   Temp 98 F (36.7 C) (Oral)   Resp 16   Ht  (1.676 m)   Wt 61.2 kg (135 lb)   SpO2 94%   BMI 21.79 kg/m   Physical Exam  Constitutional: She is oriented to person, place, and time. She appears well-developed.  HENT:  Head: Normocephalic.  Eyes: Conjunctivae and EOM are normal. No scleral icterus.  Neck: Neck supple. No thyromegaly present.  Cardiovascular: Normal rate and regular rhythm. Exam reveals no gallop and no friction rub.  No murmur heard. Pulmonary/Chest: No stridor. She has no wheezes. She has no rales. She exhibits no tenderness.  Abdominal: She exhibits no distension. There is no tenderness. There is no rebound.  Musculoskeletal: Normal range of motion. She exhibits no edema.  Lymphadenopathy:    She has no cervical adenopathy.  Neurological: She is oriented to person, place, and time. She exhibits normal muscle tone. Coordination normal.  Skin: No rash noted. No erythema.  Psychiatric: She has a normal mood and affect. Her behavior is normal.     ED Treatments / Results  Labs (all labs ordered are listed, but only abnormal results are displayed) Labs Reviewed  COMPREHENSIVE METABOLIC PANEL - Abnormal; Notable for the following components:      Result Value   Potassium 3.2 (*)    Chloride 99 (*)    Glucose, Bld 105 (*)    ALT 12 (*)    All other components within normal limits  LIPASE, BLOOD  CBC  URINALYSIS, ROUTINE W REFLEX MICROSCOPIC    EKG EKG Interpretation  Date/Time:  Monday Aug 19 2017 17:39:49 EDT Ventricular Rate:  78 PR Interval:    QRS Duration: 104 QT Interval:  420 QTC Calculation: 479 R Axis:   86 Text Interpretation:  Sinus rhythm Borderline short PR interval Borderline right axis deviation Nonspecific T abnormalities, lateral leads Confirmed by Bethann Berkshire [phone]) on  08/19/2017 6:33:11 PM   Radiology Mr Brain Wo Contrast  Result Date: 08/19/2017 CLINICAL DATA:  Ataxia with stroke suspected. EXAM: MRI HEAD WITHOUT CONTRAST TECHNIQUE: Multiplanar, multiecho pulse sequences of the brain and surrounding structures were obtained without intravenous contrast. COMPARISON:  Head CT from 12/06/2016 FINDINGS: Brain: No acute infarction, hemorrhage, hydrocephalus, extra-axial collection or mass lesion. Normal white matter and brain volume for age. Vascular: Major flow voids are preserved. Skull and upper cervical spine: No evidence of marrow lesion Sinuses/Orbits: Bilateral cataract resection. No pathologic finding. Other: Motion degraded, at times moderate to advanced. IMPRESSION: 1. Unremarkable exam for age. 2. Moderately motion degraded. Electronically Signed   By: Marnee Spring M.D.   On: 08/19/2017  15:01   Dg Abd Acute W/chest  Result Date: 08/19/2017 CLINICAL DATA:  Vomiting EXAM: DG ABDOMEN ACUTE W/ 1V CHEST COMPARISON:  CT abdomen pelvis 12/06/2016 FINDINGS: Lungs are hyperinflated. No focal airspace disease. Normal cardiomediastinal contours. No free intraperitoneal air. No dilated small bowel. No mass or abnormal calcification. No acute osseous abnormality. IMPRESSION: Negative abdominal radiographs.  No acute cardiopulmonary disease. Electronically Signed   By: Deatra Robinson M.D.   On: 08/19/2017 17:51    Procedures Procedures (including critical care time)  Medications Ordered in ED Medications  ondansetron (ZOFRAN) injection 4 mg (4 mg Intravenous Given 08/19/17 1329)  sodium chloride 0.9 % bolus 500 mL (0 mLs Intravenous Stopped 08/19/17 1609)  ondansetron (ZOFRAN) injection 4 mg (4 mg Intravenous Given 08/19/17 1507)  metoCLOPramide (REGLAN) injection 10 mg (10 mg Intravenous Given 08/19/17 1456)  sodium chloride 0.9 % bolus 1,000 mL (0 mLs Intravenous Stopped 08/19/17 1712)  promethazine (PHENERGAN) injection 12.5 mg (12.5 mg Intravenous Given 08/19/17  1608)     Initial Impression / Assessment and Plan / ED Course  I have reviewed the triage vital signs and the nursing notes.  Pertinent labs & imaging results that were available during my care of the patient were reviewed by me and considered in my medical decision making (see chart for details).     Patient with dizziness and vomiting.  CBC chemistries unremarkable except for slightly low potassium.  MRI of the head negative.  Abdominal series negative.  Patient improved with Phenergan.  Suspect either viral syndrome or inner ear problem.  Patient will be sent home with Phenergan and Antivert and will follow-up with PCP in a couple days for recheck  Final Clinical Impressions(s) / ED Diagnoses   Final diagnoses:  Dizziness    ED Discharge Orders        Ordered    promethazine (PHENERGAN) 25 MG tablet  Every 6 hours PRN     08/19/17 1834    meclizine (ANTIVERT) 25 MG tablet  3 times daily PRN     08/19/17 1834       Bethann Berkshire, MD 08/19/17 1839

## 2017-08-19 NOTE — ED Triage Notes (Signed)
Patient reports waking up at 0800 with headache, dizziness and vomiting. Patient states she feels "off balance". Patient vomiting in triage. Denies visual changes.

## 2017-08-19 NOTE — Discharge Instructions (Signed)
Drink plenty of fluids and follow up with your md in 2 days for recheck

## 2018-09-20 IMAGING — CT CT ABD-PELV W/ CM
2 of 5 series · 16 of 46 positions shown, 18 images · IV contrast (Isovue)
Comparison: None.

CLINICAL DATA: Nausea vomiting and pancreatitis

EXAM:
CT ABDOMEN AND PELVIS WITH CONTRAST
TECHNIQUE: Multidetector CT imaging of the abdomen and pelvis was performed
using the standard protocol following bolus administration of
intravenous contrast.
CONTRAST:  100mL 8TDPLX-EOO IOPAMIDOL (8TDPLX-EOO) INJECTION 61%

[Series 2: axial st · axial · 0.74mm/px · z∈[-516,-151]mm · 13 of 83 slices shown, 15 images]
[im 5/83  soft-tissue]
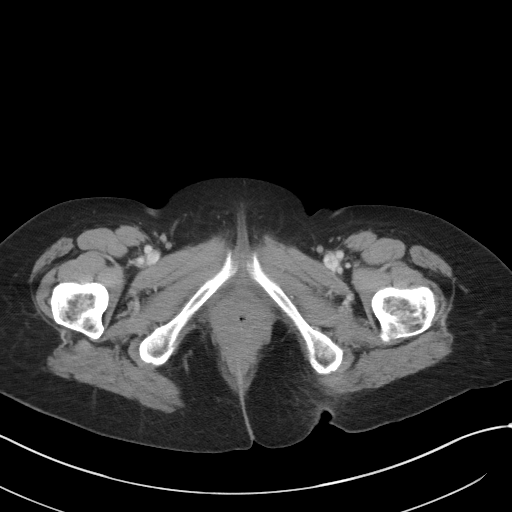
[im 5/83  bone]
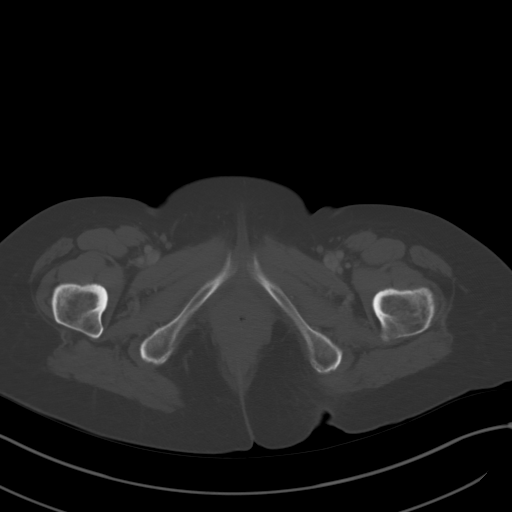
[im 10/83  soft-tissue]
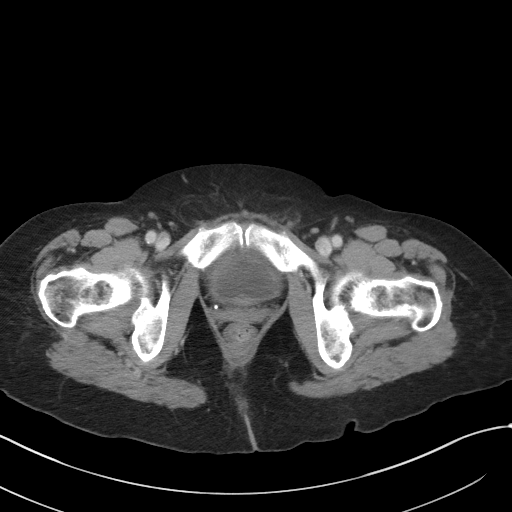
[im 19/83  soft-tissue]
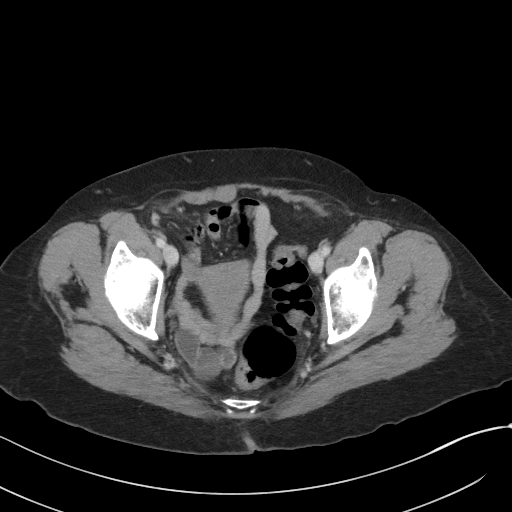
[im 23/83  soft-tissue]
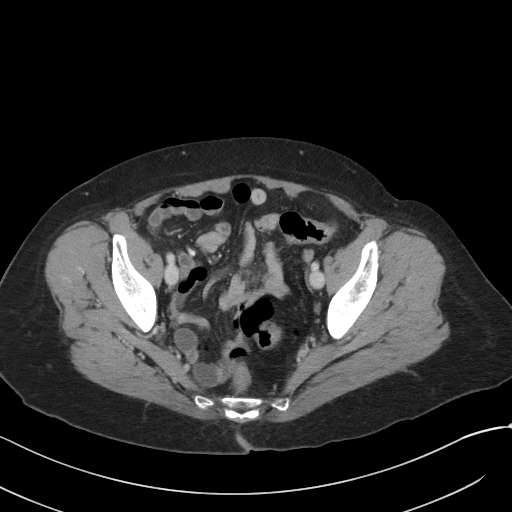
[im 28/83  soft-tissue]
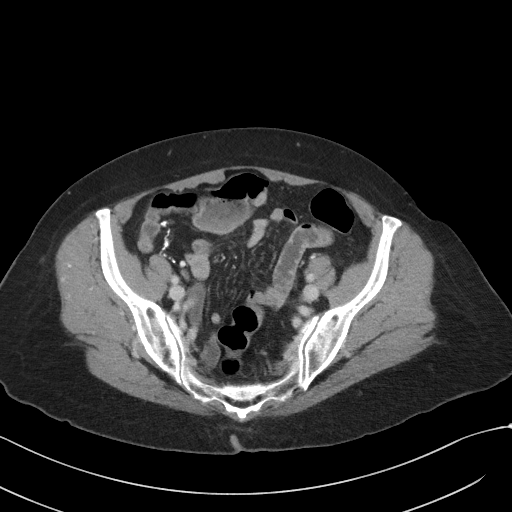
[im 37/83  soft-tissue]
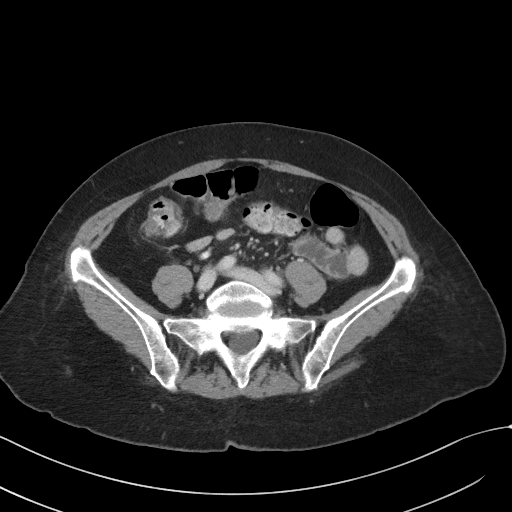
[im 42/83  soft-tissue]
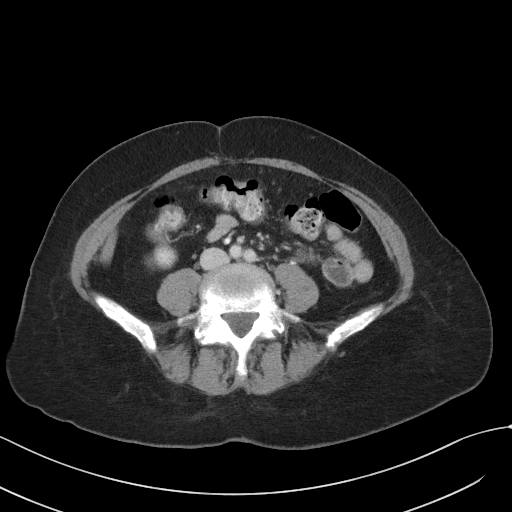
[im 46/83  soft-tissue]
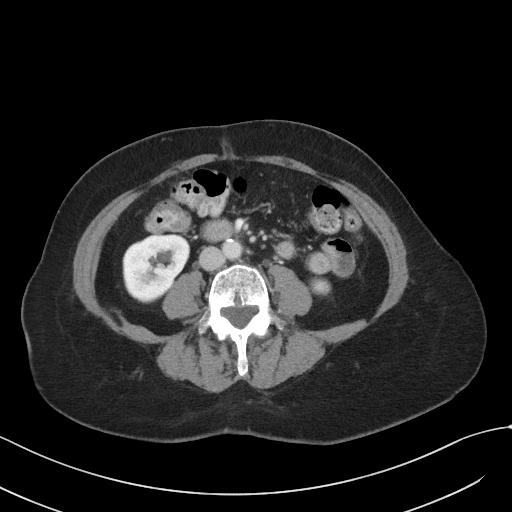
[im 55/83  soft-tissue]
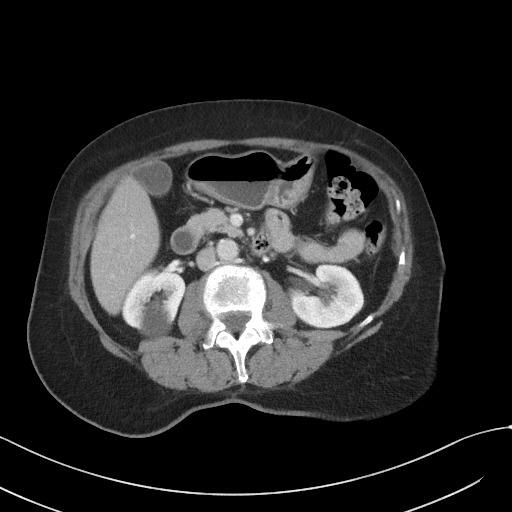
[im 55/83  bone]
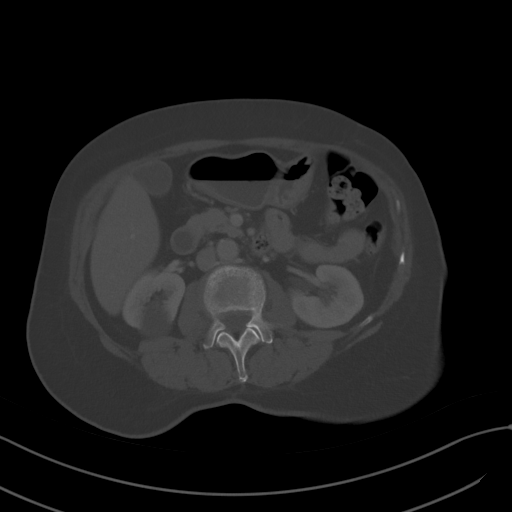
[im 60/83  soft-tissue]
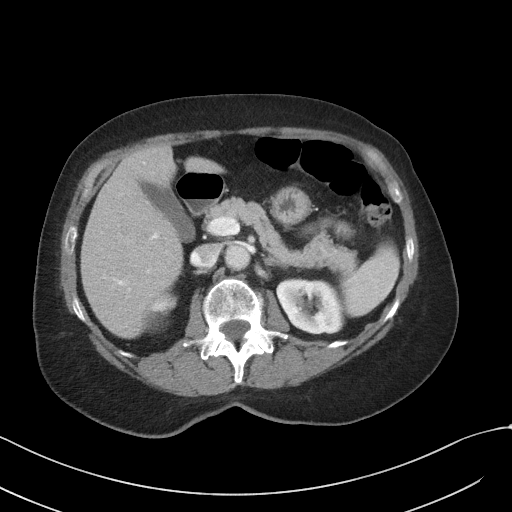
[im 64/83  soft-tissue]
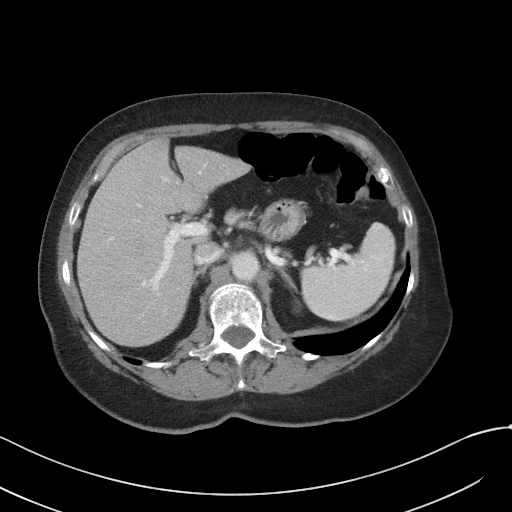
[im 73/83  soft-tissue]
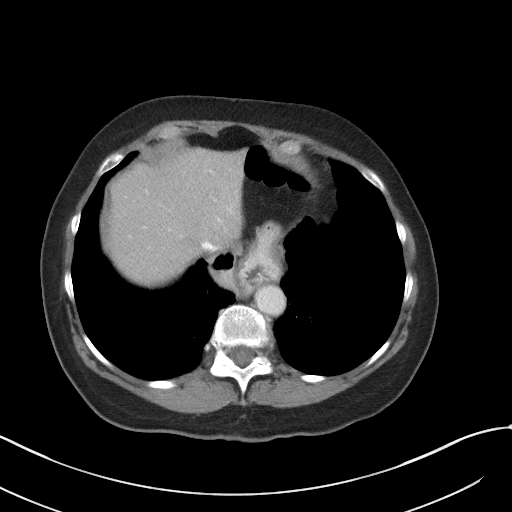
[im 78/83  soft-tissue]
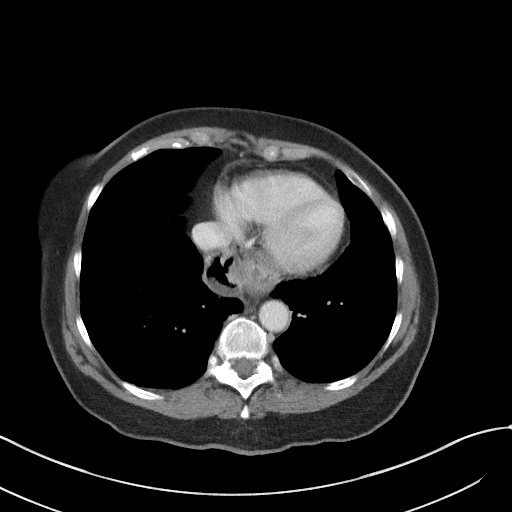

[Series 5: coronal st · coronal · 0.72mm/px · 3 of 100 slices shown]
[im 34/100  soft-tissue]
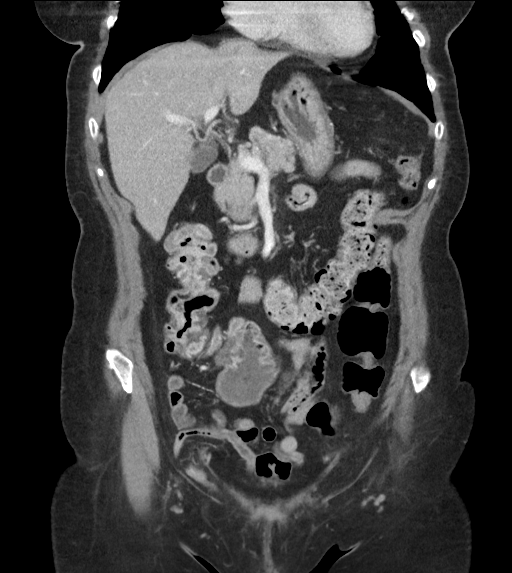
[im 45/100  soft-tissue]
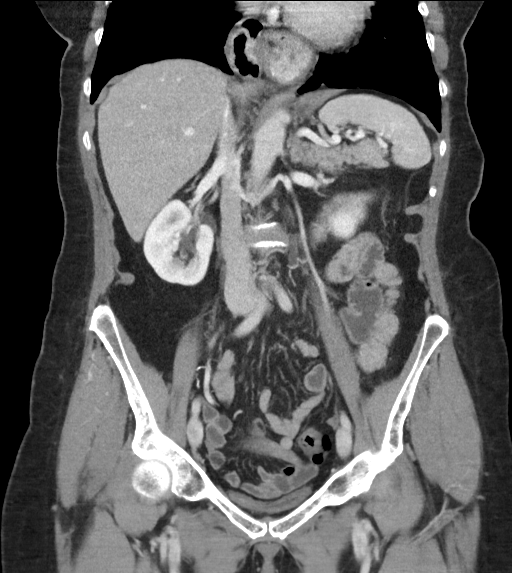
[im 56/100  soft-tissue]
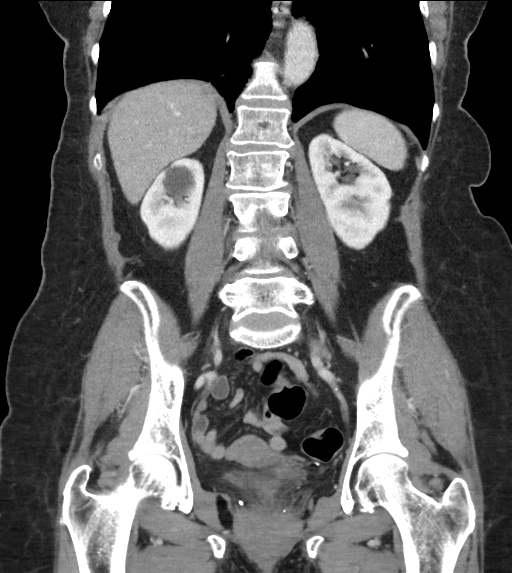

[16 of 46 positions shown; findings below may reference images not displayed]

FINDINGS: Lower chest: Lung bases demonstrate no consolidation or pleural
effusion. Moderate hiatal hernia with mild mucosal enhancement of
the herniated stomach. Normal heart size.

Hepatobiliary: 2.2 cm cyst in the medial right hepatic lobe. No
calcified gallstones or biliary dilatation

Pancreas: Unremarkable. No pancreatic ductal dilatation or
surrounding inflammatory changes.

Spleen: Normal in size without focal abnormality.

Adrenals/Urinary Tract: Adrenal glands are within normal limits. No
hydronephrosis. 3.1 cm cyst upper pole right kidney. Bladder within
normal limits.

Stomach/Bowel: Stomach nonenlarged. No dilated small bowel. No colon
wall thickening. Appendix not clearly identified, surgical changes
in the right lower quadrant suggesting prior appendectomy.

Vascular/Lymphatic: Aortic atherosclerosis. No enlarged abdominal or
pelvic lymph nodes.

Reproductive: Uterus and bilateral adnexa are unremarkable.

Other: Fat in the left inguinal canal. Fat and bowel containing
right inguinal hernia.

Musculoskeletal: Prominent infolding of the inner abdominal wall
musculature causing appearance of bandlike densities along the
lateral aspects of the abdomen. This is of uncertain significance
and etiology. No acute or suspicious bone lesion.
IMPRESSION: 1. No CT evidence for acute intra-abdominal or pelvic pathology.
2. Moderate hiatal hernia. Prominent mucosal enhancement of the
proximal herniated stomach, query a gastritis.
3. Negative for bowel obstruction.
4. Hepatic and renal cysts
5. Fat and bowel containing hernia in the right groin. No evidence
for an obstruction

## 2018-09-20 IMAGING — CT CT CERVICAL SPINE W/O CM
3 of 6 series · 14 of 33 positions shown, 16 images · non-contrast
Comparison: None.

CLINICAL DATA: Headache and dizziness with neck pain

EXAM:
CT HEAD WITHOUT CONTRAST
CT CERVICAL SPINE WITHOUT CONTRAST
TECHNIQUE: Multidetector CT imaging of the head and cervical spine was
performed following the standard protocol without intravenous
contrast. Multiplanar CT image reconstructions of the cervical spine
were also generated.

[Series 6: coronal soft tissue · coronal · 0.32mm/px · 3 of 74 slices shown]
[im 19/74  bone]
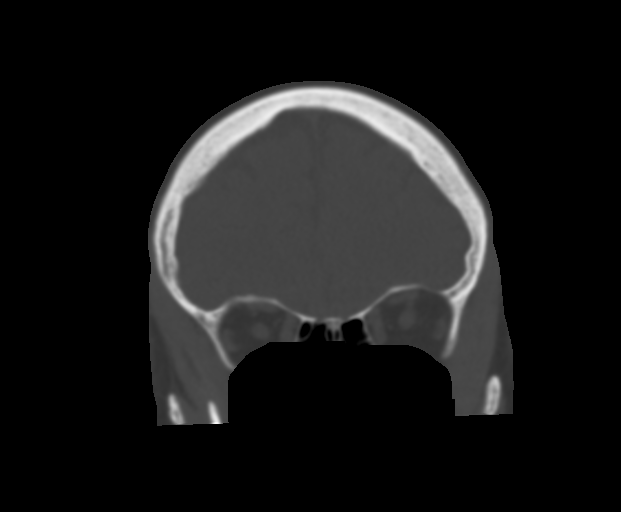
[im 37/74  bone]
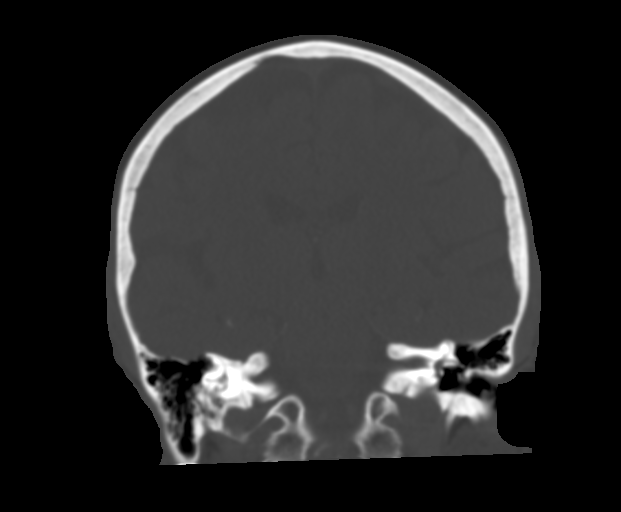
[im 55/74  bone]
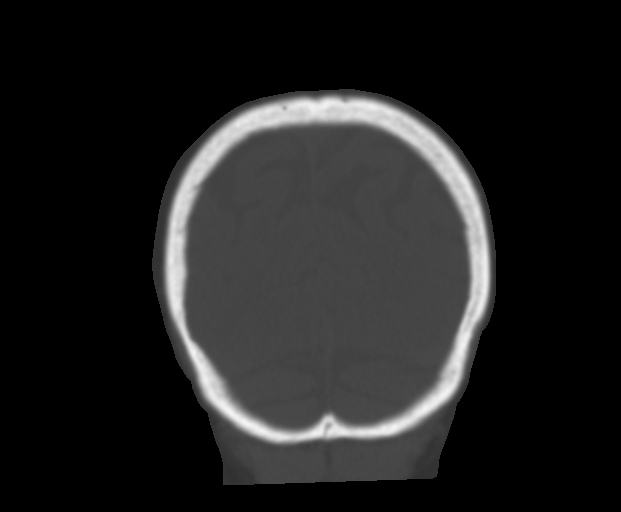

[Series 9: c spine soft · axial · 0.44mm/px · z∈[+1481,+1617]mm · 6 of 88 slices shown, 8 images]
[im 10/88  soft-tissue]
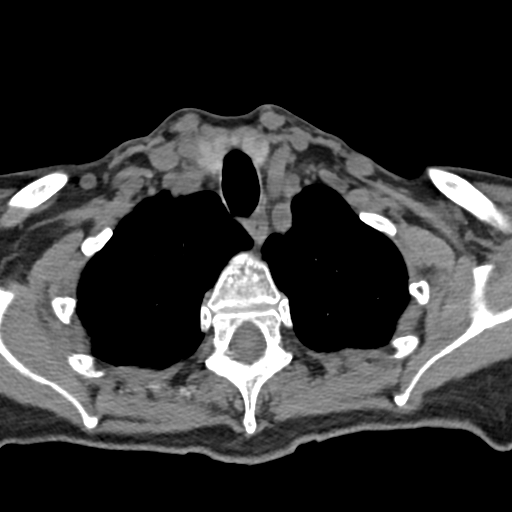
[im 10/88  bone]
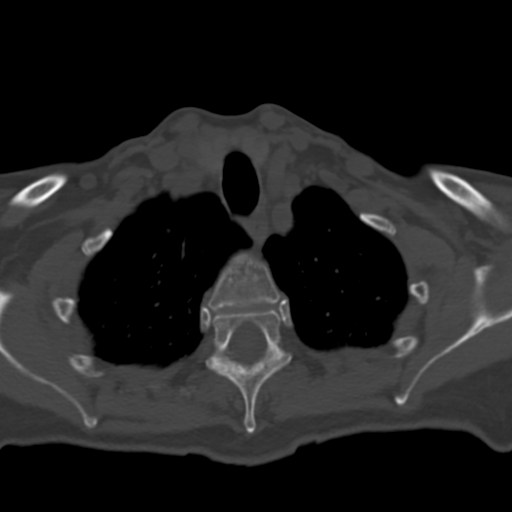
[im 30/88  bone]
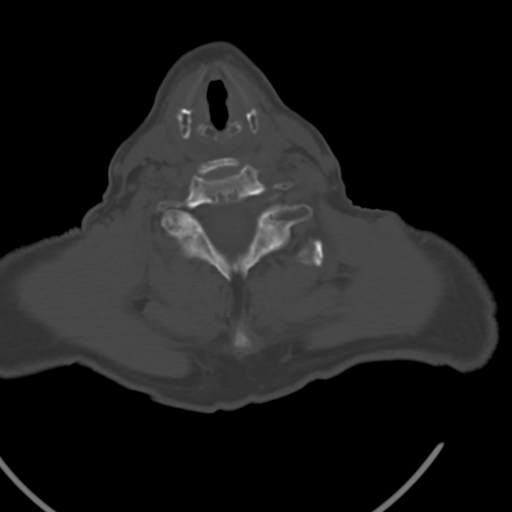
[im 39/88  bone]
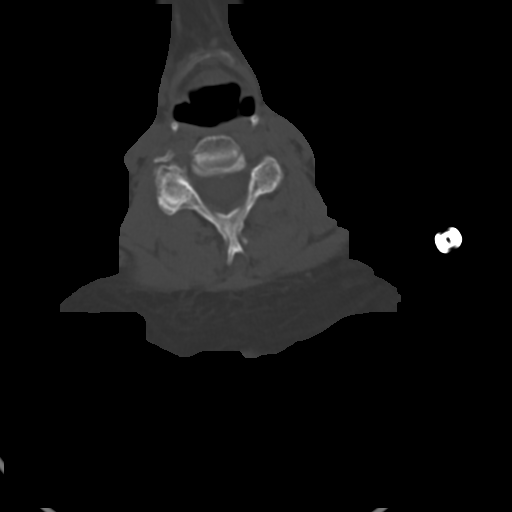
[im 49/88  bone]
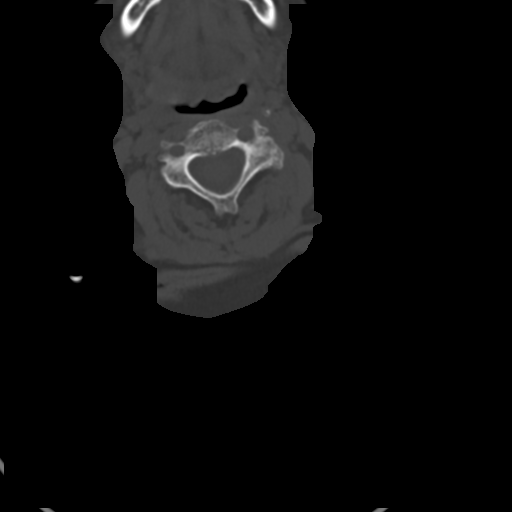
[im 68/88  soft-tissue]
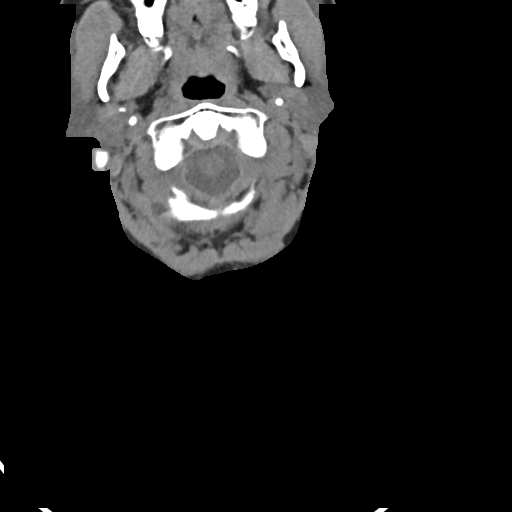
[im 68/88  bone]
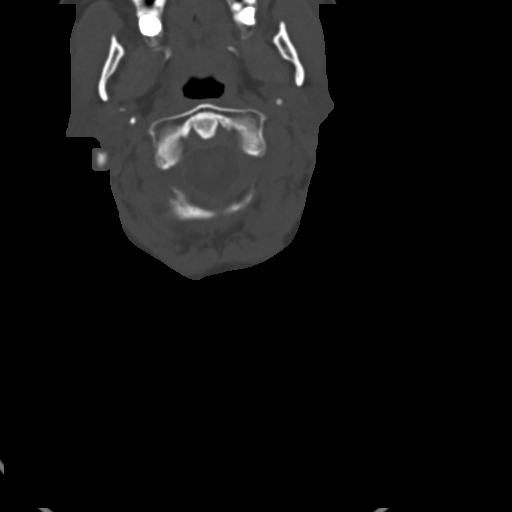
[im 78/88  bone]
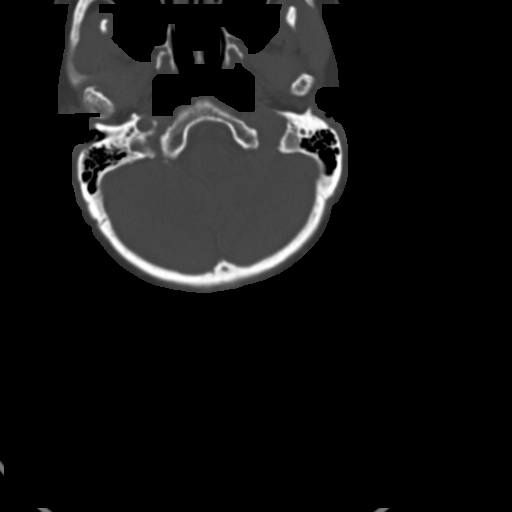

[Series 10: sagittal bone · sagittal · 0.26mm/px · 5 of 61 slices shown]
[im 11/61  bone]
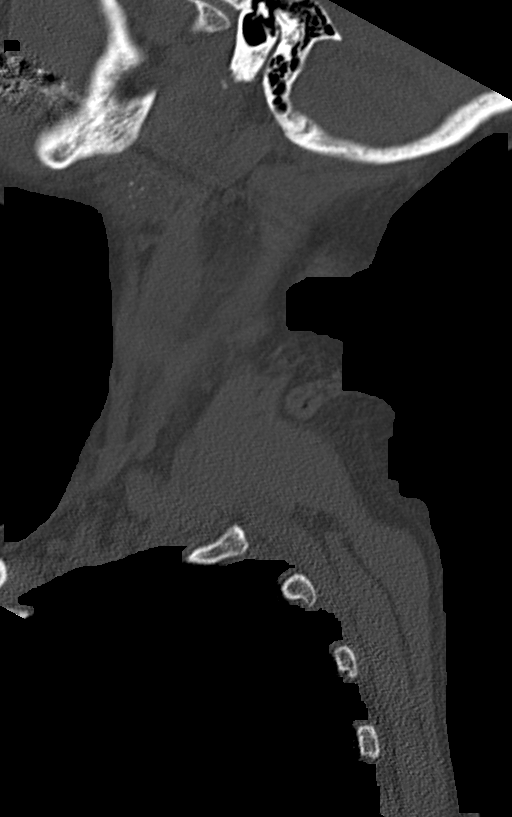
[im 21/61  bone]
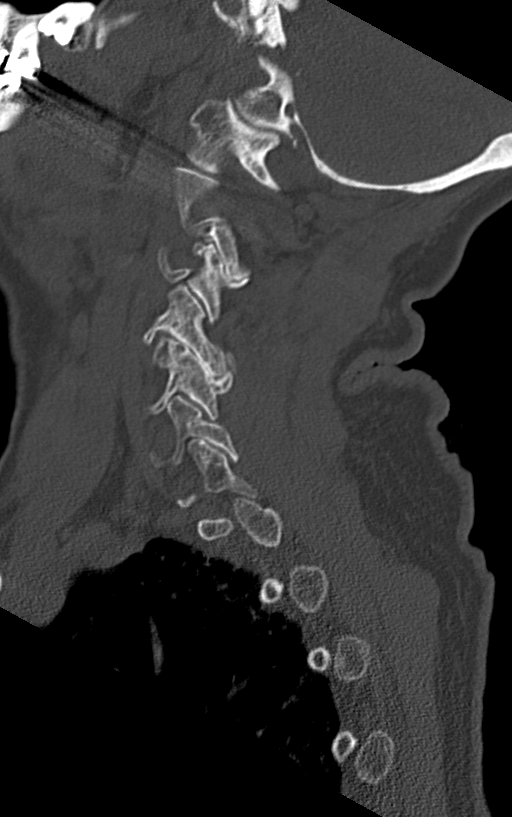
[im 31/61  bone]
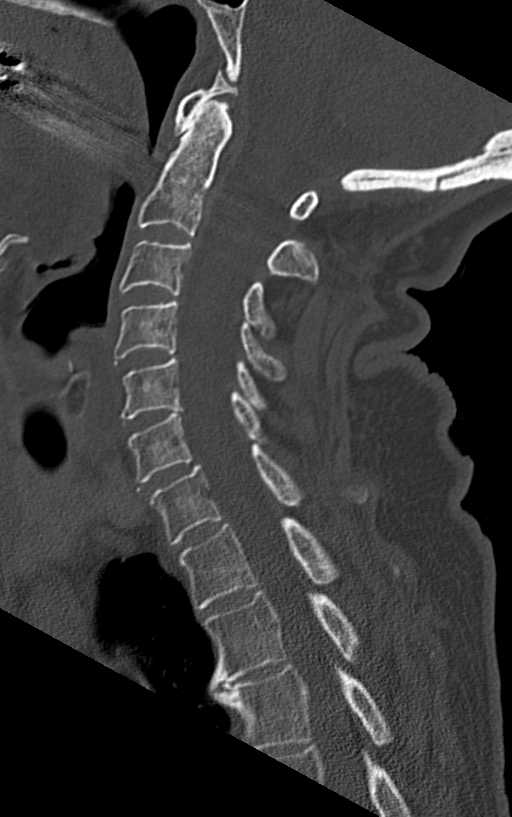
[im 41/61  bone]
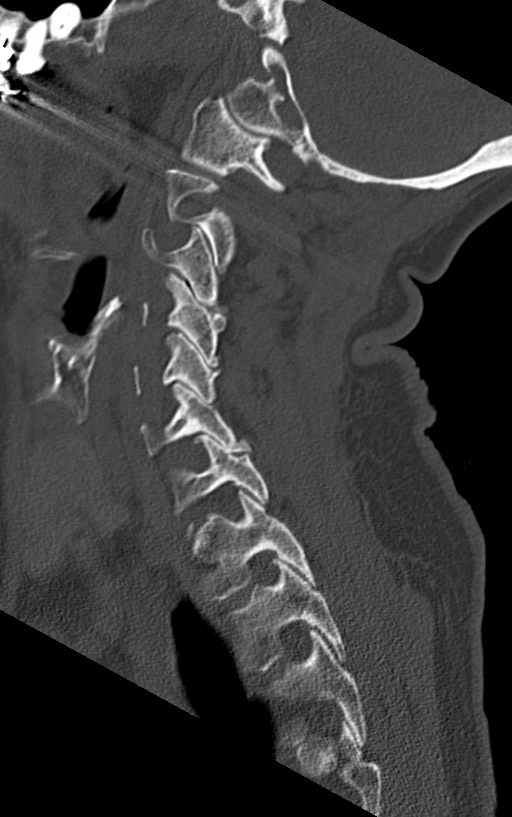
[im 51/61  bone]
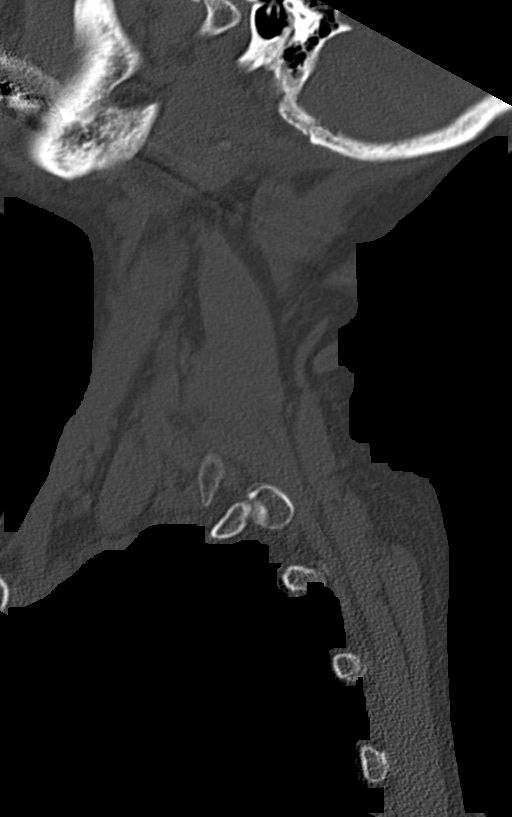

[14 of 33 positions shown; findings below may reference images not displayed]

FINDINGS: CT HEAD FINDINGS

Brain: No evidence of acute infarction, hemorrhage, hydrocephalus,
extra-axial collection or mass lesion/mass effect. There is chronic
diffuse atrophy.

Vascular: No hyperdense vessel or unexpected calcification.

Skull: Normal. Negative for fracture or focal lesion.

Sinuses/Orbits: No acute finding.

Other: None.

CT CERVICAL SPINE FINDINGS

Alignment: Normal.

Skull base and vertebrae: No acute fracture. No primary bone lesion
or focal pathologic process.

Soft tissues and spinal canal: No prevertebral fluid or swelling. No
visible canal hematoma.

Disc levels: The intervertebral disc spaces are normal. There are
facet joint sclerosis throughout cervical spine.

Upper chest: Biapical scarring are noted.

Other: Right parotid gland calcifications are noted.
IMPRESSION: No focal acute intracranial abnormality identified.

No acute fracture or dislocation of cervical spine.

Degenerative joint changes of cervical spine.
